# Patient Record
Sex: Male | Born: 1980 | Hispanic: Yes | Marital: Married | Smoking: Never smoker
Health system: Southern US, Community
[De-identification: ages and names within clinical notes are randomized; demographics above are authoritative.]

---

## 2010-09-28 ENCOUNTER — Emergency Department (HOSPITAL_COMMUNITY)
Admission: EM | Admit: 2010-09-28 | Discharge: 2010-09-29 | Disposition: A | Discharge status: Home / Self Care | Payer: Self-pay | Attending: Emergency Medicine | Admitting: Emergency Medicine

## 2010-09-28 ENCOUNTER — Emergency Department (HOSPITAL_COMMUNITY): Payer: Self-pay

## 2010-09-28 DIAGNOSIS — R110 Nausea: Secondary | ICD-10-CM | POA: Insufficient documentation

## 2010-09-28 DIAGNOSIS — K2970 Gastritis, unspecified, without bleeding: Secondary | ICD-10-CM | POA: Insufficient documentation

## 2010-09-28 DIAGNOSIS — R079 Chest pain, unspecified: Secondary | ICD-10-CM | POA: Insufficient documentation

## 2010-09-28 DIAGNOSIS — R142 Eructation: Secondary | ICD-10-CM | POA: Insufficient documentation

## 2010-09-28 DIAGNOSIS — R1013 Epigastric pain: Secondary | ICD-10-CM | POA: Insufficient documentation

## 2010-09-28 DIAGNOSIS — R141 Gas pain: Secondary | ICD-10-CM | POA: Insufficient documentation

## 2010-09-28 DIAGNOSIS — R12 Heartburn: Secondary | ICD-10-CM | POA: Insufficient documentation

## 2010-09-28 DIAGNOSIS — K219 Gastro-esophageal reflux disease without esophagitis: Secondary | ICD-10-CM | POA: Insufficient documentation

## 2010-09-28 LAB — DIFFERENTIAL
Basophils Absolute: 0 10*3/uL (ref 0.0–0.1)
Eosinophils Relative: 7 % — ABNORMAL HIGH (ref 0–5)
Lymphs Abs: 2.4 10*3/uL (ref 0.7–4.0)
Monocytes Absolute: 0.5 10*3/uL (ref 0.1–1.0)
Monocytes Relative: 5 % (ref 3–12)
Neutrophils Relative %: 60 % (ref 43–77)

## 2010-09-28 LAB — POCT CARDIAC MARKERS
CKMB, poc: <1 ng/mL — ABNORMAL LOW (ref 1.0–8.0)
Myoglobin, poc: 48.6 ng/mL (ref 12–200)

## 2010-09-28 LAB — CBC
HCT: 48.9 % (ref 39.0–52.0)
Hemoglobin: 18 g/dL — ABNORMAL HIGH (ref 13.0–17.0)
RBC: 6.2 MIL/uL — ABNORMAL HIGH (ref 4.22–5.81)
WBC: 8.9 10*3/uL (ref 4.0–10.5)

## 2010-09-28 LAB — BASIC METABOLIC PANEL
Calcium: 9.7 mg/dL (ref 8.4–10.5)
GFR calc Af Amer: >60 mL/min (ref >60)
GFR calc non Af Amer: >60 mL/min (ref >60)
Glucose, Bld: 93 mg/dL (ref 70–99)
Sodium: 137 mEq/L (ref 135–145)

## 2010-09-29 ENCOUNTER — Emergency Department (HOSPITAL_COMMUNITY): Payer: Self-pay

## 2010-09-29 LAB — HEPATIC FUNCTION PANEL
AST: 26 U/L (ref 0–37)
Albumin: 4.3 g/dL (ref 3.5–5.2)
Bilirubin, Direct: 0.1 mg/dL (ref 0.0–0.3)

## 2012-01-11 ENCOUNTER — Emergency Department (HOSPITAL_COMMUNITY)
Admission: EM | Admit: 2012-01-11 | Discharge: 2012-01-11 | Disposition: A | Discharge status: Home / Self Care | Payer: No Typology Code available for payment source | Attending: Emergency Medicine | Admitting: Emergency Medicine

## 2012-01-11 ENCOUNTER — Encounter (HOSPITAL_COMMUNITY): Payer: Self-pay | Admitting: *Deleted

## 2012-01-11 ENCOUNTER — Emergency Department (HOSPITAL_COMMUNITY): Payer: No Typology Code available for payment source

## 2012-01-11 DIAGNOSIS — R079 Chest pain, unspecified: Secondary | ICD-10-CM | POA: Insufficient documentation

## 2012-01-11 DIAGNOSIS — R109 Unspecified abdominal pain: Secondary | ICD-10-CM | POA: Insufficient documentation

## 2012-01-11 DIAGNOSIS — M549 Dorsalgia, unspecified: Secondary | ICD-10-CM | POA: Insufficient documentation

## 2012-01-11 DIAGNOSIS — R220 Localized swelling, mass and lump, head: Secondary | ICD-10-CM | POA: Insufficient documentation

## 2012-01-11 DIAGNOSIS — S298XXA Other specified injuries of thorax, initial encounter: Secondary | ICD-10-CM

## 2012-01-11 DIAGNOSIS — S8010XA Contusion of unspecified lower leg, initial encounter: Secondary | ICD-10-CM | POA: Insufficient documentation

## 2012-01-11 LAB — POCT I-STAT, CHEM 8
BUN: 12 mg/dL (ref 6–23)
Chloride: 104 mEq/L (ref 96–112)
Creatinine, Ser: 1 mg/dL (ref 0.50–1.35)
Potassium: 3.4 mEq/L — ABNORMAL LOW (ref 3.5–5.1)
Sodium: 142 mEq/L (ref 135–145)

## 2012-01-11 LAB — DIFFERENTIAL
Eosinophils Relative: 2 % (ref 0–5)
Lymphocytes Relative: 8 % — ABNORMAL LOW (ref 12–46)
Lymphs Abs: 1.4 10*3/uL (ref 0.7–4.0)
Neutro Abs: 13.8 10*3/uL — ABNORMAL HIGH (ref 1.7–7.7)
Neutrophils Relative %: 85 % — ABNORMAL HIGH (ref 43–77)

## 2012-01-11 LAB — CBC
MCV: 79 fL (ref 78.0–100.0)
Platelets: 202 10*3/uL (ref 150–400)
RBC: 5.82 MIL/uL — ABNORMAL HIGH (ref 4.22–5.81)
WBC: 16.2 10*3/uL — ABNORMAL HIGH (ref 4.0–10.5)

## 2012-01-11 MED ORDER — SODIUM CHLORIDE 0.9 % IV SOLN
INTRAVENOUS | Status: DC
  Administered 2012-01-11: 21:00:00 via INTRAVENOUS

## 2012-01-11 MED ORDER — FENTANYL CITRATE 0.05 MG/ML IJ SOLN
50.0000 ug | Freq: Once | INTRAMUSCULAR | Status: AC
  Administered 2012-01-11: 50 ug via INTRAVENOUS
  Filled 2012-01-11: qty 2

## 2012-01-11 MED ORDER — IOHEXOL 300 MG/ML  SOLN
100.0000 mL | Freq: Once | INTRAMUSCULAR | Status: AC | PRN
  Administered 2012-01-11: 100 mL via INTRAVENOUS

## 2012-01-11 MED ORDER — OXYCODONE-ACETAMINOPHEN 5-325 MG PO TABS: 1.0000 | ORAL_TABLET | Freq: Four times a day (QID) | ORAL | Status: AC | PRN

## 2012-01-11 MED ORDER — SODIUM CHLORIDE 0.9 % IV BOLUS (SEPSIS)
500.0000 mL | Freq: Once | INTRAVENOUS | Status: AC
  Administered 2012-01-11: 500 mL via INTRAVENOUS
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
## 2012-01-11 NOTE — ED Notes (Signed)
Pt does not speak english. ems spoke spanish to patient for rn to triage
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
## 2012-01-11 NOTE — ED Notes (Signed)
Notified pt of x-ray/CT results.  Pt verbalized understanding, had no further questions.
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
## 2012-01-11 NOTE — ED Notes (Signed)
Per ems: pt does not have any neuro deficts
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
## 2012-01-11 NOTE — Discharge Instructions (Signed)
Traumatismo Torcico Contuso (Blunt Chest Trauma)  El traumatismo torcico contuso es una lesin causada por un golpe en el pecho. Estas lesiones suelen ser muy dolorosas. Generalmente resulta en un hematoma o en costillas rotas (fracturadas). La mayor parte de los hematomas y las fracturas de costillas por traumatismos torcicos contusos mejoran despus de 1 a 3 semanas de reposo y uso de medicamentos para Chief Technology Officer. Generalmente, los tejidos blandos de la pared torcica tambin se lesionan, lo que produce dolor y hematomas. Los rganos internos, como el corazn y los pulmones, tambin pueden sufrir lesiones. El traumatismo torcico contuso puede producir problemas mdicos graves. Este tipo de lesin requiere atencin mdica inmediata.  CAUSAS   Colisiones en vehculos de motor.   Cadas.   Violencia fsica.   Lesiones deportivas.  SNTOMAS   Dolor en el pecho. El dolor puede empeorar al moverse o respirar profundamente.   Falta de aire.   Aturdimiento.   Hematomas.   Sensibilidad.   Hinchazn.  DIAGNSTICO  El mdico le har un examen fsico. Podrn tomarle radiografas para comprobar si hay fracturas. Sin embargo, las fracturas pequeas en las costillas pueden no aparecer en las radiografas hasta unos das despus de la lesin. Si se sospecha una lesin ms grave, podrn indicarle otras pruebas de diagnstico por imgenes. Puede incluir ecografas, tomografa computada (TC) o resonancia magntica (IMR).  TRATAMIENTO  El tratamiento depende de la gravedad de la lesin. El mdico podr recetarle medicamentos para Primary school teacher e indicarle ejercicios de respiracin profunda.  INSTRUCCIONES PARA EL CUIDADO EN EL HOGAR   Limite sus actividades hasta que pueda moverse sin sentir Scientist, forensic.   No realice trabajos extenuantes hasta que la lesin se haya curado.   Aplique hielo sobre la zona lesionada.   Ponga el hielo en una bolsa plstica.   Colquese una toalla entre la  piel y la bolsa de hielo.   Deje el hielo durante 15 a 20 minutos, 3 a 4 veces por da.   Podr utilizar una faja para las costillas para reducir Chief Technology Officer segn le hayan indicado.   Realice inspiraciones, profundas segn las indicaciones del mdico, para mantener los pulmones limpios.   Slo tome medicamentos de venta libre o recetados para Primary school teacher, la fiebre, o el [place], segn las indicaciones de su mdico.  SOLICITE ATENCIN MDICA DE [employer] SI:  Siente falta de aire o dolor en el pecho cada vez ms intensos.   Tose y escupe sangre.   Tiene nuseas, vmitos o dolor abdominal.   Tiene fiebre.   Se siente mareado, dbil o se desmaya.  ASEGRESE DE QUE:   Comprende estas instrucciones.   Controlar su enfermedad.   Solicitar ayuda de inmediato si no mejora o si empeora.  Document Released: 07/12/2005 Document Revised: 07/01/2011 [facility] Patient Information 2012 [place], [place].Colisin con un vehculo de motor Academic librarian) Luego de una colisin, es comn presentar mltiples moretones y Research scientist (life sciences). Estas molestias generalmente empeoran durante las primeras 24 horas. Usted gradualmente se pondr ms rgido y con ms dolor en las horas siguientes. Podr sentirse peor cuando despierte en la maana siguiente al accidente. A partir de all, debera comenzar a Risk manager que pase. La velocidad con que se mejora generalmente depende de la gravedad de la colisin, la cantidad de lesiones y la ubicacin y Firefighter de las mismas. INSTRUCCIONES PARA EL CUIDADO EN EL HOGAR   Aplique hielo sobre la zona lesionada.   Ponga el hielo en [employer]  plstica.   Colquese una toalla entre la piel y la bolsa de hielo.   Deje el hielo durante 15 a 20 minutos, 3 a 4 veces por da.   Debe ingerir gran cantidad de lquido para mantener la orina de tono claro o color amarillo plido.  No beba alcohol.   Tome una ducha o un bao caliente o bese una o dos  veces por da. Esto aumentar el flujo de [employer] msculos doloridos.   Puede volver a sus ocupaciones cuando se lo indique el mdico. Tenga cuidado al levantar objetos, ya que puede agravar el dolor en el cuello o en la espalda.   Utilice los medicamentos de venta libre o de prescripcin para Chief Technology Officer, Environmental health practitioner o la [place], segn se lo indique el profesional que lo asiste. No tome aspirina. Podran aumentar los hematomas o las hemorragias.  SOLICITE ATENCIN MDICA DE INMEDIATO SI SIENTE:  Entumecimiento, hormigueo, debilidad o problemas con el uso de los brazos o las piernas.   Dolor de cabeza intenso que no mejora con medicamentos.   Siente dolor intenso en el cuello, especialmente sensibilidad en el centro de la espalda o el cuello.   Cambios en el control del intestino o la vejiga.   Aumento del dolor en cualquier parte del cuerpo.   Falta de aire, mareos o [place].   Siente dolor en el pecho.   Nuseas, vmitos o sudoracin.   Aumento del Dentist abdominal.   Sangre en la orina, en las heces o vmitos con [place].   Siente dolor en los hombros (en la zona de los breteles).   Que sus sntomas empeoran.  EST SEGURO QUE:   Comprende las instrucciones para el alta mdica.   Controlar su enfermedad.   Solicitar atencin mdica de inmediato segn las indicaciones.  Document Released: 04/21/2005 Document Revised: 07/01/2011 [facility] Patient Information 2012 [place], [place].Contusin  (Contusion)  Una contusin es un hematoma interno. Las contusiones son el resultado de un traumatismo que produce un sangrado debajo de la piel. La contusin [employer], prpura o [place]. Un traumatismo menor ocasionar un hematoma indoloro, pero las contusiones ms importantes pueden doler y Personal assistant hinchadas durante varias semanas.  CAUSAS  Generalmente la causa de la contusin es un golpe, un traumatismo o una fuerza directa ejercida en una zona del cuerpo.    SNTOMAS   Hinchazn y enrojecimiento en la zona lesionada.   Hematoma en la zona lesionada.   Sensibilidad e inflamacin en la zona lesionada.   Dolor.  DIAGNSTICO  El diagnstico puede hacerse a travs de la historia clnica y el examen fsico. Ser necesaria una radiografa o una tomografa computada para determinar si hay lesiones asociadas, como fracturas.  TRATAMIENTO  El tratamiento especfico depender de qu parte del cuerpo se lesion. En general, el mejor tratamiento para una contusin es el reposo, hielo, elevacin, y la aplicacin de compresas fras en el rea afectada. Tambin se recomiendan los medicamentos de venta libre para el control del dolor. Pregunte a su mdico cul es el mejor tratamiento para su contusin.  INSTRUCCIONES PARA EL CUIDADO EN EL HOGAR   Aplique hielo sobre la zona lesionada.   Ponga el hielo en una bolsa plstica.   Colquese una toalla entre la piel y la bolsa de hielo.   Deje el hielo durante 15 a 20 minutos, 3 a 4 veces por da.   Slo tome medicamentos de venta libre o recetados para Primary school teacher, las molestias o Publishing copy  la fiebre segn las indicaciones de su mdico. El mdico puede indicarle que evite los antiinflamatorios (aspirina, ibuprofeno y naproxeno) durante 48 horas debido a que estos medicamentos pueden aumentar el hematoma.   Haga que la zona lesionada repose.   En lo posible, eleve la zona lesionada para disminuir la hinchazn.  SOLICITE ATENCIN MDICA DE INMEDIATO SI:   El hematoma o la hinchazn aumentan.   Siente que Community education officer.   El dolor o la hinchazn no se alivian con los medicamentos.  ASEGRESE DE QUE:   Comprende estas instrucciones.   Controlar su enfermedad.   Solicitar ayuda de inmediato si no mejora o si empeora.  Document Released: 04/21/2005 Document Revised: 07/01/2011 [facility] Patient Information 2012 [place], [place].

## 2012-03-20 IMAGING — US US ABDOMEN COMPLETE
1 series · 14 of 25 positions shown · non-contrast
Comparison: None.

CLINICAL DATA: Chest pain, abdominal pain.

COMPLETE ABDOMINAL ULTRASOUND

[Series 1: us abdomen complete · 0.30mm/px · 14 of 46 slices shown]
[im 1/46]
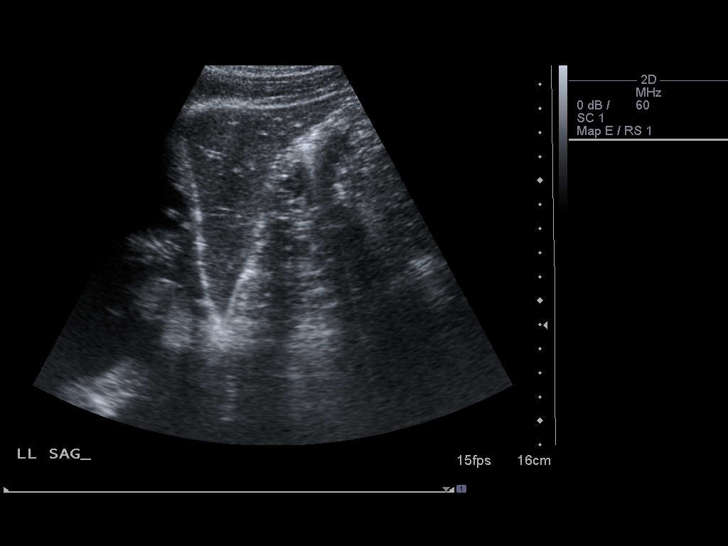
[im 4/46]
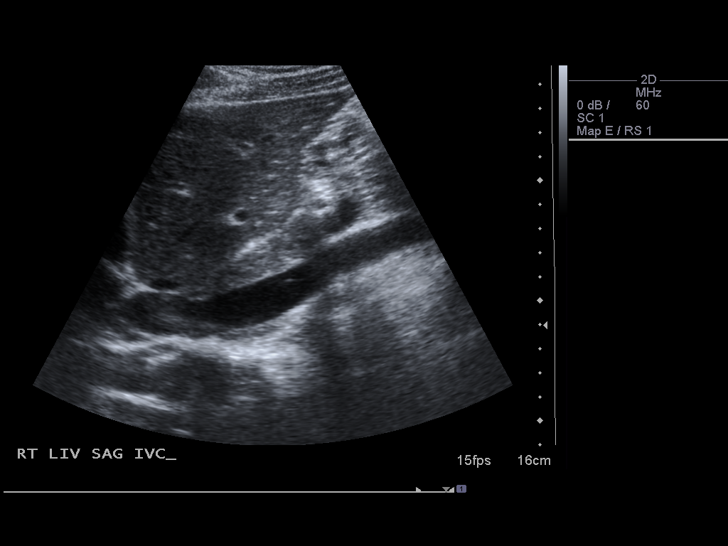
[im 8/46]
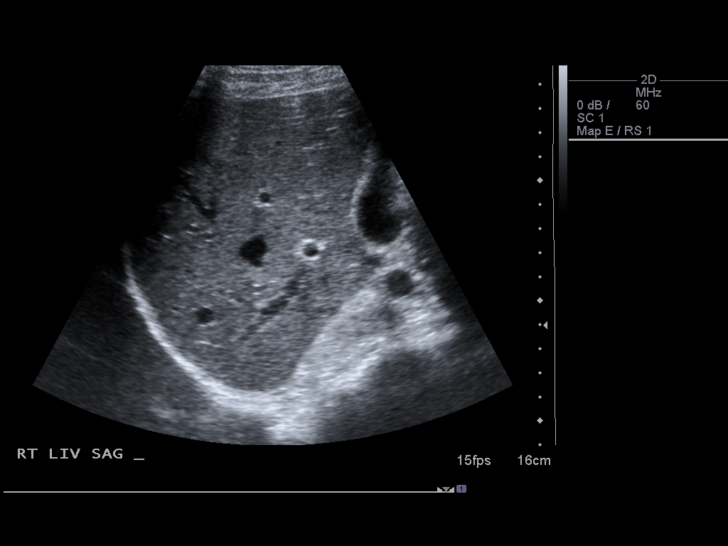
[im 12/46]
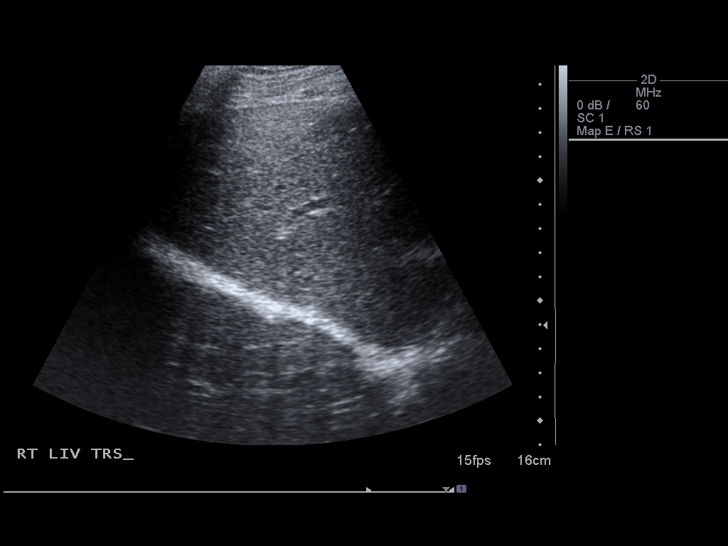
[im 16/46]
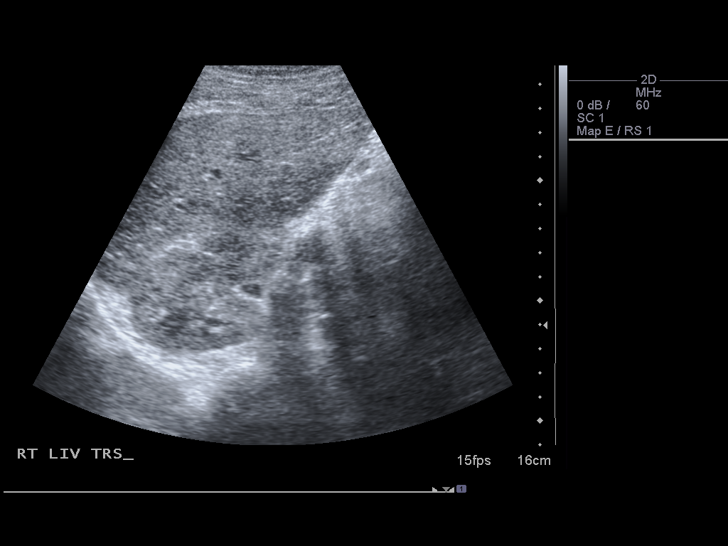
[im 17/46]
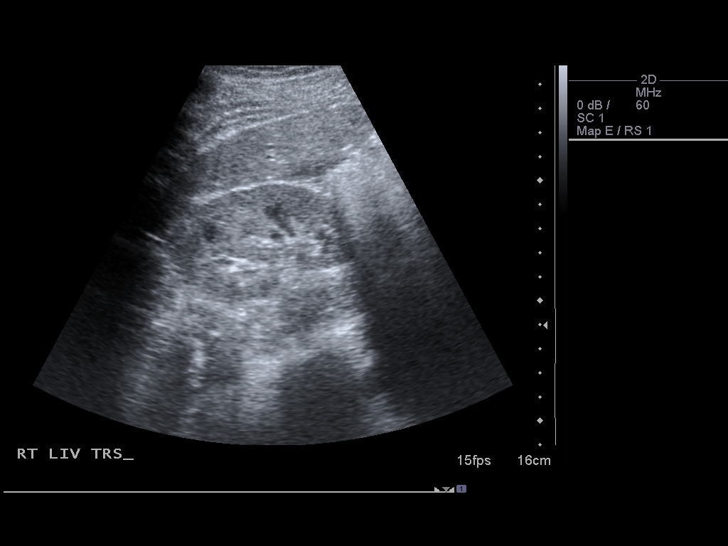
[im 21/46]
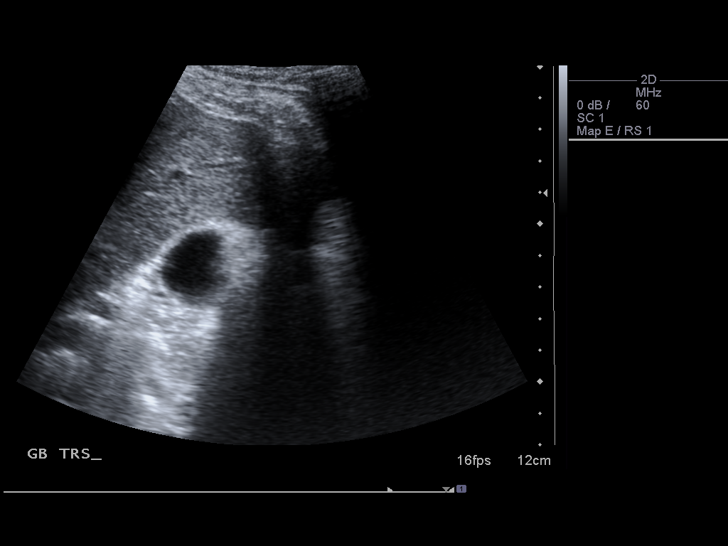
[im 25/46]
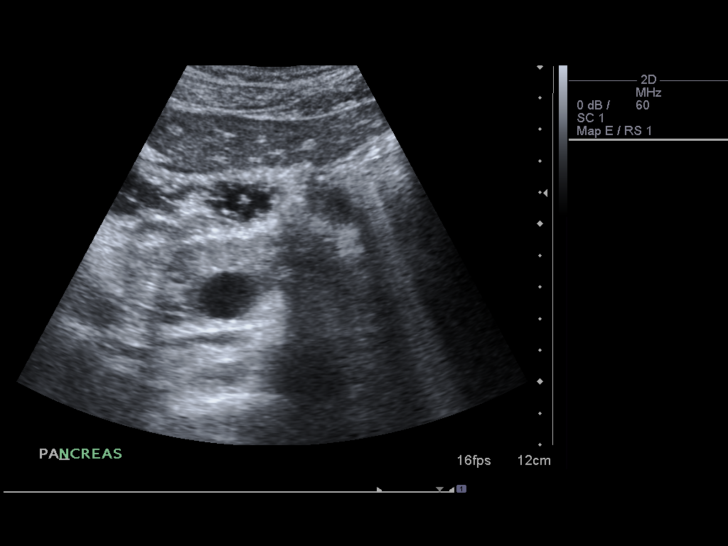
[im 29/46]
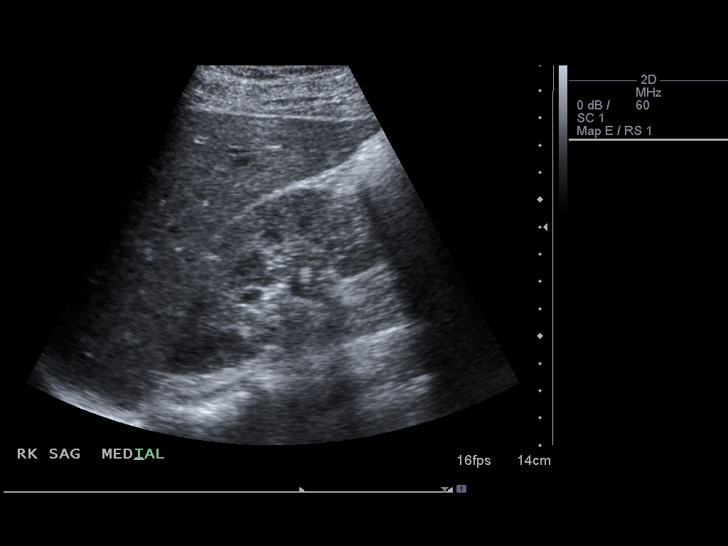
[im 31/46]
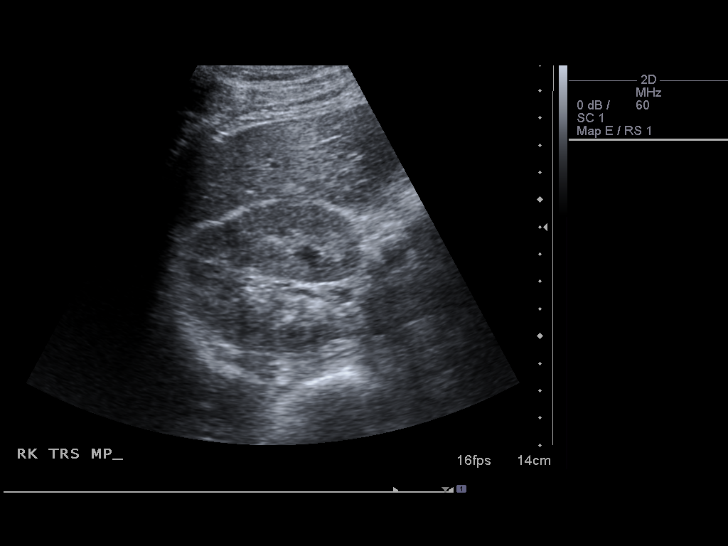
[im 34/46]
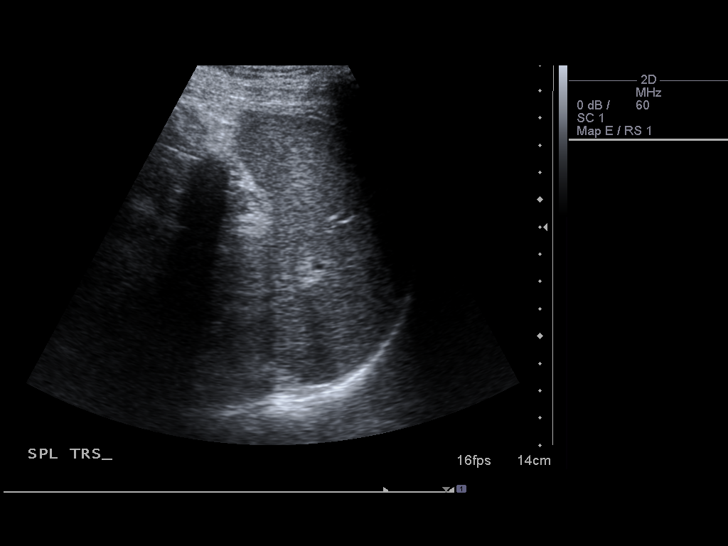
[im 38/46]
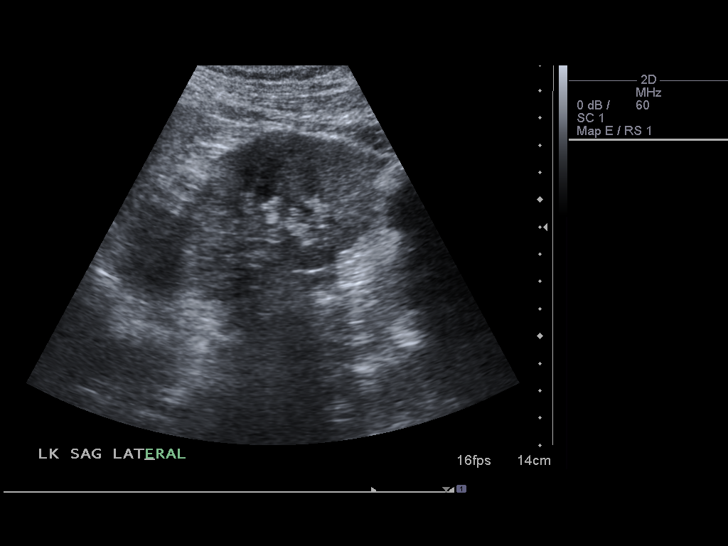
[im 42/46]
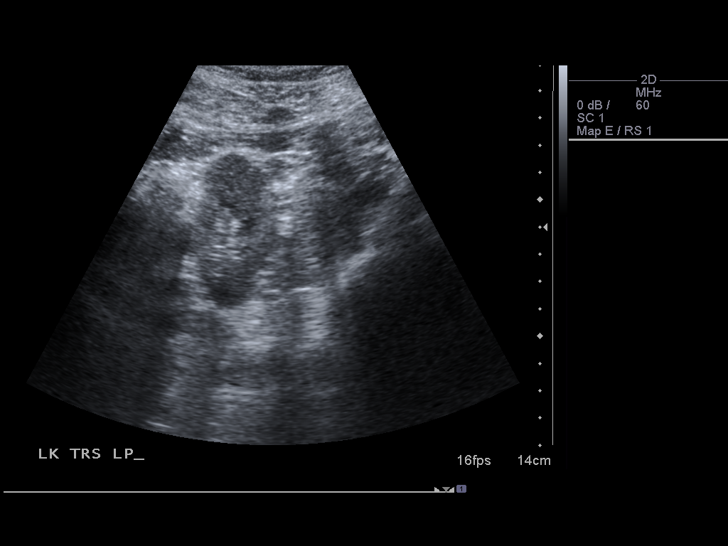
[im 46/46]
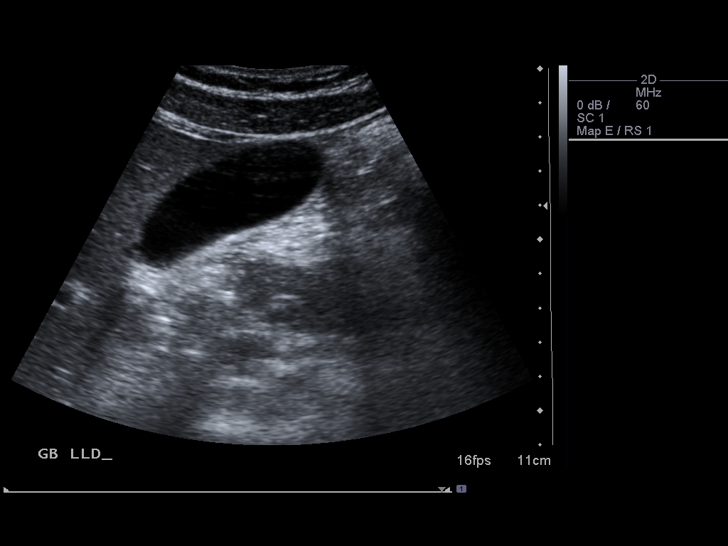

[14 of 25 positions shown; findings below may reference images not displayed]

FINDINGS: Gallbladder:  No gallstones, gallbladder wall thickening, or
pericholecystic fluid.

Common bile duct:   Within normal limits in caliber.

Liver:  No focal lesion identified.  Within normal limits in
parenchymal echogenicity.

IVC:  Appears normal.

Pancreas:  No focal abnormality seen.

Spleen:  Within normal limits in size and echotexture.

Right Kidney:   Normal in size and parenchymal echogenicity.  No
evidence of mass or hydronephrosis.

Left Kidney:  Normal in size and parenchymal echogenicity.  No
evidence of mass or hydronephrosis.

Abdominal aorta:  No aneurysm identified.
IMPRESSION: Negative abdominal ultrasound.
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
## 2013-07-10 ENCOUNTER — Other Ambulatory Visit: Payer: Self-pay | Admitting: Family

## 2013-07-10 ENCOUNTER — Other Ambulatory Visit: Payer: Self-pay

## 2013-07-10 DIAGNOSIS — R05 Cough: Secondary | ICD-10-CM

## 2015-09-26 ENCOUNTER — Encounter (HOSPITAL_COMMUNITY): Payer: Self-pay | Admitting: *Deleted

## 2015-09-26 ENCOUNTER — Emergency Department (HOSPITAL_COMMUNITY): Payer: Self-pay

## 2015-09-26 ENCOUNTER — Emergency Department (HOSPITAL_COMMUNITY)
Admission: EM | Admit: 2015-09-26 | Discharge: 2015-09-26 | Disposition: A | Discharge status: Home / Self Care | Payer: Self-pay | Attending: Emergency Medicine | Admitting: Emergency Medicine

## 2015-09-26 DIAGNOSIS — N5089 Other specified disorders of the male genital organs: Secondary | ICD-10-CM | POA: Insufficient documentation

## 2015-09-26 DIAGNOSIS — R110 Nausea: Secondary | ICD-10-CM | POA: Insufficient documentation

## 2015-09-26 DIAGNOSIS — N50812 Left testicular pain: Secondary | ICD-10-CM

## 2015-09-26 LAB — URINALYSIS, ROUTINE W REFLEX MICROSCOPIC
BILIRUBIN URINE: NEGATIVE
GLUCOSE, UA: NEGATIVE mg/dL
HGB URINE DIPSTICK: NEGATIVE
KETONES UR: NEGATIVE mg/dL
LEUKOCYTES UA: NEGATIVE
Nitrite: NEGATIVE
PROTEIN: NEGATIVE mg/dL
Specific Gravity, Urine: 1.021 (ref 1.005–1.030)
pH: 5.5 (ref 5.0–8.0)

## 2015-09-26 LAB — BASIC METABOLIC PANEL
Anion gap: 12 (ref 5–15)
BUN: 10 mg/dL (ref 6–20)
CALCIUM: 9.6 mg/dL (ref 8.9–10.3)
CO2: 24 mmol/L (ref 22–32)
CREATININE: 0.75 mg/dL (ref 0.61–1.24)
Chloride: 103 mmol/L (ref 101–111)
GFR calc non Af Amer: >60 mL/min (ref >60)
Glucose, Bld: 99 mg/dL (ref 65–99)
Potassium: 3.9 mmol/L (ref 3.5–5.1)
SODIUM: 139 mmol/L (ref 135–145)

## 2015-09-26 LAB — CBC
HCT: 47.5 % (ref 39.0–52.0)
Hemoglobin: 17.3 g/dL — ABNORMAL HIGH (ref 13.0–17.0)
MCH: 28.8 pg (ref 26.0–34.0)
MCHC: 36.4 g/dL — AB (ref 30.0–36.0)
MCV: 79 fL (ref 78.0–100.0)
PLATELETS: 191 10*3/uL (ref 150–400)
RBC: 6.01 MIL/uL — ABNORMAL HIGH (ref 4.22–5.81)
RDW: 12.5 % (ref 11.5–15.5)
WBC: 6.7 10*3/uL (ref 4.0–10.5)

## 2015-09-26 MED ORDER — OXYCODONE-ACETAMINOPHEN 5-325 MG PO TABS
ORAL_TABLET | ORAL | Status: AC
  Filled 2015-09-26: qty 1

## 2015-09-26 MED ORDER — OXYCODONE-ACETAMINOPHEN 5-325 MG PO TABS
1.0000 | ORAL_TABLET | Freq: Once | ORAL | Status: AC
  Administered 2015-09-26: 1 via ORAL

## 2015-09-26 MED ORDER — IBUPROFEN 800 MG PO TABS: 800.0000 mg | ORAL_TABLET | Freq: Three times a day (TID) | ORAL | Status: AC | PRN

## 2015-09-26 NOTE — Discharge Instructions (Signed)
Read the information below.  Use the prescribed medication as directed.  Please discuss all new medications with your pharmacist.  You may return to the Emergency Department at any time for worsening condition or any new symptoms that concern you.   If you develop high fevers, worsening abdominal pain, uncontrolled vomiting, or are unable to tolerate fluids by mouth, return to the ER for a recheck.    Lea la informacin a continuacin. Use la medicacin prescrita como se indica. Por favor discuta todos los nuevos medicamentos con su farmacutico. Usted puede regresar al Departamento de Emergencias en cualquier momento por empeoramiento de la condicin o cualquier nuevo sntoma que le afecte. Si desarrolla fiebres altas, empeora el dolor abdominal, vmitos incontrolados, o es incapaz de Web designer lquidos por la boca, regrese a la sala de emergencias para una revisin.   Autoexamen testicular (Testicular Self-Exam) El autoexamen de los testculos consiste en la observacin y palpacin de los testculos para buscar bultos o hinchazn anormales. Hay varios factores que pueden causar hinchazn, bultos o dolor en los testculos. Entre ellos se incluyen:  Lesiones.  Inflamacin.  Infeccin.  Acumulacin de lquido en el testculo (hidrocele).  Testculos rotados (torsin testicular).  Cncer de testculo. El autoexamen de la zona de los testculos y de la ingle se aconseja si usted tiene riesgo de Doctor, hospital. Los riesgos de cncer testicular son:  Un testculo no descendido (criptorquidismo).  Historia previa de cncer testicular.  Historia familiar de cncer testicular. Es ms fcil examinarlos despus de una ducha o un bao caliente y es ms difcil examinarlos cuando usted est fro. Esto se debe a que los msculos adheridos a los testculos se retraen y los Research scientist (physical sciences) hacia el abdomen. Siga estos pasos mientras est de pie:  Sostenga el pene separado del cuerpo.  Haga rodar un  testculo entre el pulgar y el ndice, sintiendo todo el testculo.  Haga rodar el otro testculo entre el pulgar y el ndica, sintiendo todo el testculo. Sienta la presencia de bultos, hinchazn o molestias. Un testculo normal tiene la forma de un huevo y se siente firme. Es blando y no duele. El cordn espermtico se siente como una cuerda firme, similar a un espagueti en la parte posterior del testculo. Tambin es importante que examine el pliegue que se encuentra entre el frente de la pierna y el abdomen. Palpe si hay bultos que le duelen. Podra tratarse de ganglios linfticos agrandados.    Esta informacin no tiene Theme park manager el consejo del mdico. Asegrese de hacerle al mdico cualquier pregunta que tenga.   Document Released: 04/21/2005 Document Revised: 03/14/2013 Elsevier Interactive Patient Education 2016 [employer].  [employer] Assistance The United Ways 211 is a great source of information about community services available.  Access by dialing 2-1-1 from anywhere in [place], or by website -  PooledIncome.pl.   Other Local Resources (Updated 07/2015)  Financial Assistance   Services    Phone Number and Address  [facility]  Low-cost medical care - 1st and 3rd Saturday of every month  Must not qualify for public or private insurance and must have limited income [phone] [address] [place], [place]    [facility] [employer] of Social Services  Child care  Emergency assistance for housing and [employer]  Medicaid [phone] [address] [place], [place] [address]   [facility] Department  Low-cost medical care for children, communicable diseases, sexually-transmitted diseases, immunizations, maternity care,  womens health and family planning [phone] [address] [place], [place] [address]  [facility] Medication  Management [facility]   Medication assistance for [facility] residents  Must meet income requirements [phone] [address] [place], [place].    [facility] Social Services  Child care  Emergency assistance for housing and [employer]  Medicaid [phone] [address] [place], [place] [address]  [facility]   Low-cost medical care,   Monday through Friday, 9 am to 6 pm.   Accepts Medicare/Medicaid, and self-pay [phone] [address] [place], [place] [address]  [facility]  Low-cost medical care - Monday through Friday, 8:30 am - 5:30 pm  Accepts Medicaid and self-pay [phone] [address] [place], [place] [address]   [facility]  Primary medical care, including for those with sickle cell disease  Accepts Medicare, Medicaid, insurance and self-pay [phone] [address] [place], [place]  [facility]   Primary medical care  Accepts Medicare, [place], insurance and self-pay [phone] [address] [place], [place] [address]   [facility] Department of Social Services  Child care  Emergency assistance for housing and [employer]  Medicaid [phone] [address] [place], [place] [address]  [facility] Department of Health and [employer]  Child care  Emergency assistance for housing and [employer]  Medicaid [phone] [address] [place], [place] [address]   [facility] Medication Assistance Program  Medication assistance for [facility] residents with no insurance only  Must have a primary care doctor [phone] [address] [place], [place]  [facility]   Primary medical care  [place], [place], insurance  [phone] [address] [place], [place]  [facility]   Medication  assistance [phone]  [facility]   Primary medical care  Accepts Medicare, [place], insurance and self-pay [phone] [address] [place], [place] [address]  [facility]   Primary medical care  Accepts Medicare, [place], insurance and self-pay [phone] [address] [place], [place] [address]  [facility]  For [place] residents between the ages of 27 and 61 who do not have any form of health insurance, Medicare, [place], or [place] benefits.  Services are provided free of charge to uninsured patients who fall within federal poverty guidelines.    Hours: Tuesdays and Thursdays, 4:15 - 8 pm [phone] [address] [place], [place] [address]  [facility]     Primary medical care  Dental care  Nutritional counseling  Pharmacy  Accepts Medicaid, Medicare, most insurance.  Fees are adjusted based on ability to pay.   [phone] [facility] [address] [place], [place]  [phone] [facility] [address] [place], [place]  [phone] [facility] [place], [place]  [phone] [facility], [address] [place], [place]  [phone] [facility] [address] [place], [place]  [facility]  Womens health and family planning [phone] [address] [place], [place]  [facility] Department of Social Services  Child care  Emergency assistance for housing and [employer]  Medicaid [phone] [address], [place], [place] [address]   [facility]    Ages 63 and older  Hours: Mondays and Thursdays, 7:00 am -  9:00 am Patients are seen on a first come, first served basis. [phone], ext. [phone] [place], [place]  [facility] of Social Services  Child care  Emergency  assistance for housing and [employer]ly[employer]arkutilities  Food stamps  Medicaid [phone] [address] [place], [place] [id number]  [facility]  Medication assistance  Rental assistance  Food pantry  Medication assistance  Housing assistance  Emergency food distribution  Utility assistance [phone] [address] [place], [place]  [phone] [phone]  [address] [place], [place] [phone] Hours: Tuesdays and Thursdays from 9am - 12 noon by appointment only  [phone] [address] [place], [place] [address]  [facility] - Lanae Boastlara F. Gunn   Accepts private insurance, [place], and [place].  Payment is based on a sliding scale for those without insurance.  Hours: Mondays, Tuesdays and Thursdays, 8:30 am - 5:30 pm.   [phone] [address] [place], [place]  [facility] - [facility] at [facility]    Accepts private insurance, [place], and [place].  Payment is based on a sliding scale for those without insurance. [phone] [address] [place], [place]  [facility] - Pediatrics at E. Scientist, research (physical sciences)[place]  Accepts private insurance, [employer]icare, and [place]icaid.  Payment is based on a sliding scale for those without insurance [phone] [address], [place], [place]  [facility] - [facility]s at [place]  Accepts private insurance, [place], and [place].  Payment is based on a sliding scale for those without insurance. [phone] [address] [place], [place]  [facility] - [facility]s at [facility]  Accepts private insurance, [place], and [place].  Payment is based on a sliding scale for those without insurance. [phone], [phone]. [phone] [place], [place].    [facility]  Maternity care.  Accepts Medicaid and self-pay. [phone] [address] [place], [place]

## 2015-09-26 NOTE — ED Notes (Signed)
PT states L inguinal and L testicle pain/swelling x 3 days.  Denies problems urinating.

## 2015-09-26 NOTE — ED Provider Notes (Signed)
CSN: [id number]     Arrival date & time 09/26/15  1243 History  By signing my name below, I, Freida Busmaniana Omoyeni, attest that this documentation has been prepared under the direction and in the presence of non-physician practitioner, Trixie DredgeEmily Elan Mcelvain, PA-C. Electronically Signed: Freida Busmaniana Omoyeni, Scribe. 09/26/2015. 2:22 PM.    Chief Complaint  Patient presents with  . Groin Pain   The history is provided by the patient. No language interpreter was used.     HPI Comments:  Dustin Ferguson is a 35 y.o. male who presents to the Emergency Department complaining of strong pain since yesterday to his LLQ of the abdomen and left testicle and increased swelling at the site. His pain is exacerbated with urination and when coughing. Pt reports associated nausea, and a mass on his left testicle. He has 6 month history of a left sided pain that hurts more after working a lot  that he assumes is a hernia, but is unsure if symptoms today is related. Pt denies fever, vomiting, diarrhea, constipation, bloody stool, and penile discharge. No alleviating factors noted.  Pt denies any sexual activity or concern for STD.    History reviewed. No pertinent past medical history. History reviewed. No pertinent past surgical history. No family history on file. Social History  Substance Use Topics  . Smoking status: Never Smoker   . Smokeless tobacco: None  . Alcohol Use: Yes     Comment: occ    Review of Systems  Constitutional: Negative for fever and chills.  Gastrointestinal: Positive for abdominal pain. Negative for vomiting, diarrhea, constipation and blood in stool.  Genitourinary: Positive for testicular pain. Negative for discharge.  All other systems reviewed and are negative.   Allergies  Review of patient's allergies indicates no known allergies.  Home Medications   Prior to Admission medications   Not on File   BP 125/82 mmHg  Pulse 65  Temp(Src) 97.9 F (36.6 C) (Oral)  Resp 16  Wt 187 lb  (84.823 kg) Physical Exam  Constitutional: He appears well-developed and well-nourished. No distress.  HENT:  Head: Normocephalic and atraumatic.  Neck: Neck supple.  Cardiovascular: Normal rate and regular rhythm.   Pulmonary/Chest: Effort normal and breath sounds normal. No respiratory distress. He has no wheezes. He has no rales.  Abdominal: Soft. He exhibits no distension and no mass. There is tenderness (LLQ). There is no rebound and no guarding. Hernia confirmed negative in the left inguinal area.  Genitourinary: Penis normal. Right testis shows no mass, no swelling and no tenderness. Right testis is descended. Left testis shows mass and tenderness. Left testis shows no swelling. Left testis is descended. No penile erythema. No discharge found.  Nodule noted to left testicle No erythema, edema, of warmth of scrotum   Chaperone (RN) was present for exam which was performed with no discomfort or complications.   Neurological: He is alert. He exhibits normal muscle tone.  Skin: He is not diaphoretic.  Nursing note and vitals reviewed.   ED Course  Procedures    DIAGNOSTIC STUDIES:  Oxygen Saturation is 100% on RA, normal by my interpretation.    COORDINATION OF CARE:  1:19 PM Discussed treatment plan with pt at bedside and pt agreed to plan.  Labs Review Labs Reviewed  URINALYSIS, ROUTINE W REFLEX MICROSCOPIC (NOT AT [facility]) - Abnormal; Notable for the following:    APPearance CLOUDY (*)    All other components within normal limits  CBC - Abnormal; Notable for the following:  RBC 6.01 (*)    Hemoglobin 17.3 (*)    MCHC 36.4 (*)    All other components within normal limits  BASIC METABOLIC PANEL    Imaging Review US Scrotum  09/26/2015  CLINICAL DATA:  Left testicle pain for 1 day. EXAM: SCROTAL ULTRASOUND DOPPLER ULTRASOUND OF THE TESTICLES TECHNIQUE: Complete ultrasound examination of the testicles, epididymis, and other scrotal structures was performed. Color and  spectral Doppler ultrasound were also utilized to evaluate blood flow to the testicles. COMPARISON:  None. FINDINGS: Right testicle Measurements: 3.4 x 2.3 x 2.8 cm. No mass or microlithiasis visualized. Left testicle Measurements: 3.5 x 2.1 x 2.5 cm. No mass or microlithiasis visualized. No gross abnormality in the left inguinal canal which is the area of concern. Right epididymis:  Normal in size and appearance. Left epididymis:  Normal in size and appearance. Hydrocele:  None visualized. Varicocele:  None visualized. Pulsed Doppler interrogation of both testes demonstrates normal low resistance arterial and venous waveforms bilaterally. IMPRESSION: Normal scrotal ultrasound.  Negative for testicular torsion. Electronically Signed   By: Richarda Overlie M.D.   On: 09/26/2015 15:51   [place] Art/ven Flow Abd Pelv Doppler  09/26/2015  CLINICAL DATA:  Left testicle pain for 1 day. EXAM: SCROTAL ULTRASOUND DOPPLER ULTRASOUND OF THE TESTICLES TECHNIQUE: Complete ultrasound examination of the testicles, epididymis, and other scrotal structures was performed. Color and spectral Doppler ultrasound were also utilized to evaluate blood flow to the testicles. COMPARISON:  None. FINDINGS: Right testicle Measurements: 3.4 x 2.3 x 2.8 cm. No mass or microlithiasis visualized. Left testicle Measurements: 3.5 x 2.1 x 2.5 cm. No mass or microlithiasis visualized. No gross abnormality in the left inguinal canal which is the area of concern. Right epididymis:  Normal in size and appearance. Left epididymis:  Normal in size and appearance. Hydrocele:  None visualized. Varicocele:  None visualized. Pulsed Doppler interrogation of both testes demonstrates normal low resistance arterial and venous waveforms bilaterally. IMPRESSION: Normal scrotal ultrasound.  Negative for testicular torsion. Electronically Signed   By: Richarda Overlie M.D.   On: 09/26/2015 15:51      MDM   Final diagnoses:  Pain in left testicle  Testicular mass     Afebrile, nontoxic patient with LLQ abdominal and left inguinal/testicular pain that began last night. Pt has had intermittent pain in the LLQ and left testicle x 6 months.  I was unable to palpate a hernia.  He has no vomiting, no distension, no change in bowel movement - no clinical concern for SBO.  He does not have cellulitis or swelling and denies any possibility of STD (and declines testing and treatment), doubt epididymitis/orchitis.  He does have a palpable nodule on his left testicle that was not noted on [place].  Pt does indicate that it is new.  I have strongly advised that he follow up with urology out of concern to r/o testicular cancer.  He verbalized understanding of this and indicates that he will call for an appointment.   D/C home with urology follow up.  Discussed result, findings, treatment, and follow up  with patient.  Pt given return precautions.  Pt verbalizes understanding and agrees with plan.       I personally performed the services described in this documentation, which was scribed in my presence. The recorded information has been reviewed and is accurate.    Trixie Dredge, PA-C 09/26/15 1838  Mancel Bale, MD 09/28/15 [phone]

## 2017-07-13 IMAGING — US US ART/VEN ABD/PELV/SCROTUM DOPPLER LTD
1 series · 14 of 25 positions shown · non-contrast
Comparison: None.

CLINICAL DATA: Left testicle pain for 1 day.

EXAM:
SCROTAL ULTRASOUND
DOPPLER ULTRASOUND OF THE TESTICLES
TECHNIQUE: Complete ultrasound examination of the testicles, epididymis, and
other scrotal structures was performed. Color and spectral Doppler
ultrasound were also utilized to evaluate blood flow to the
testicles.

[Series 1: us art/ven abd/pelv/scrotum doppler ltd · 0.06mm/px · 14 of 52 slices shown]
[im 1/52]
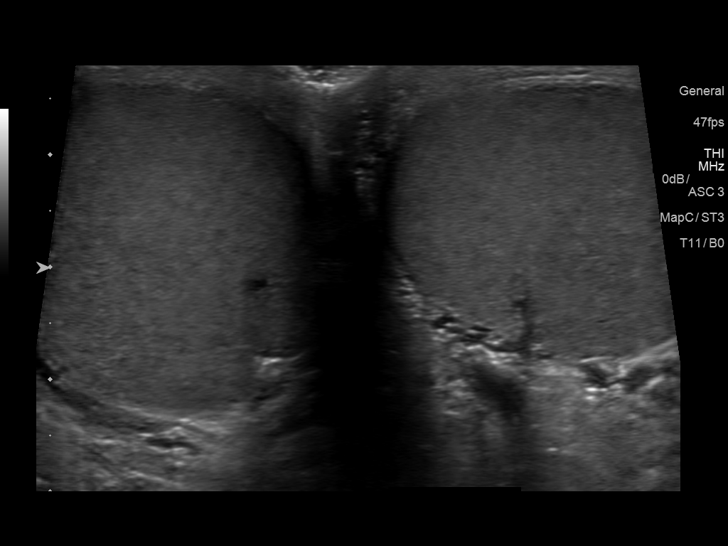
[im 5/52]
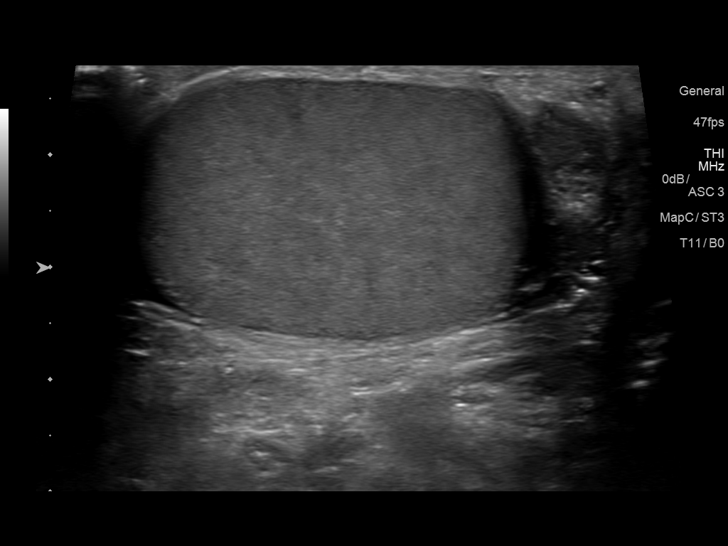
[im 9/52]
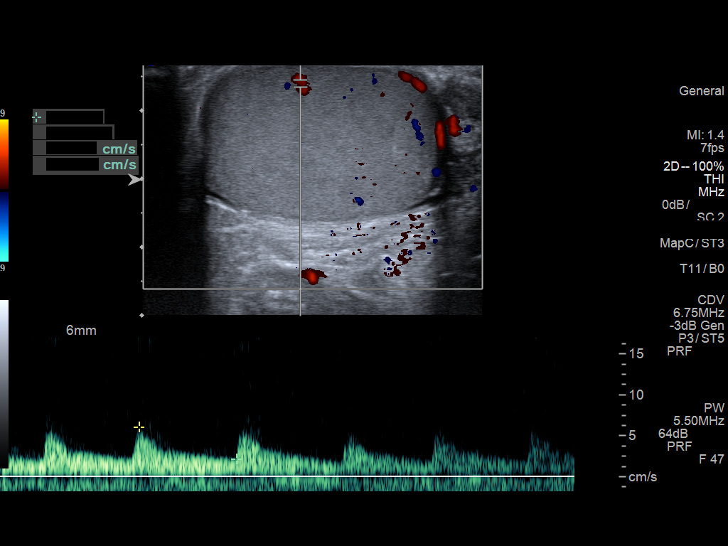
[im 13/52]
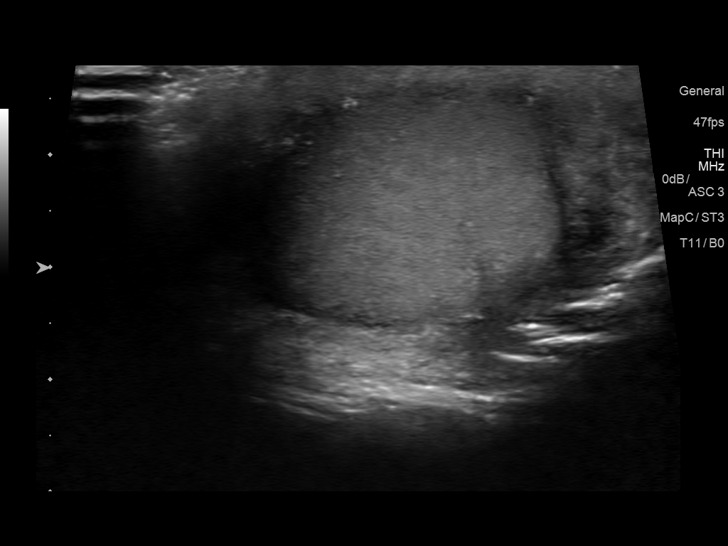
[im 18/52]
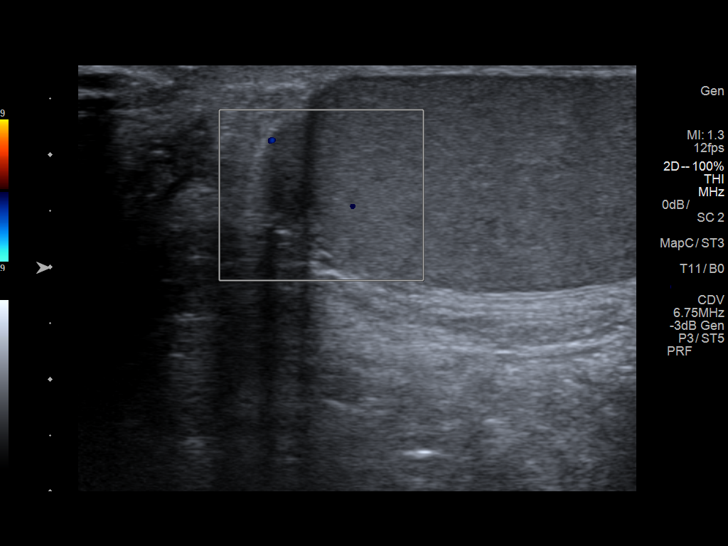
[im 20/52]
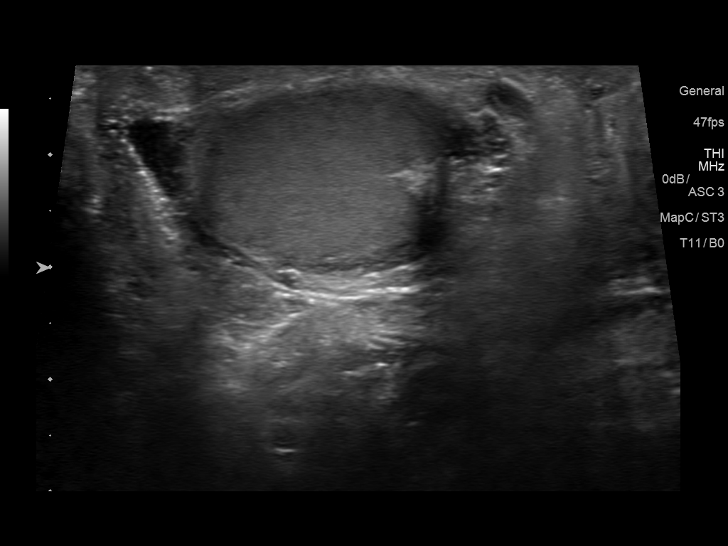
[im 24/52]
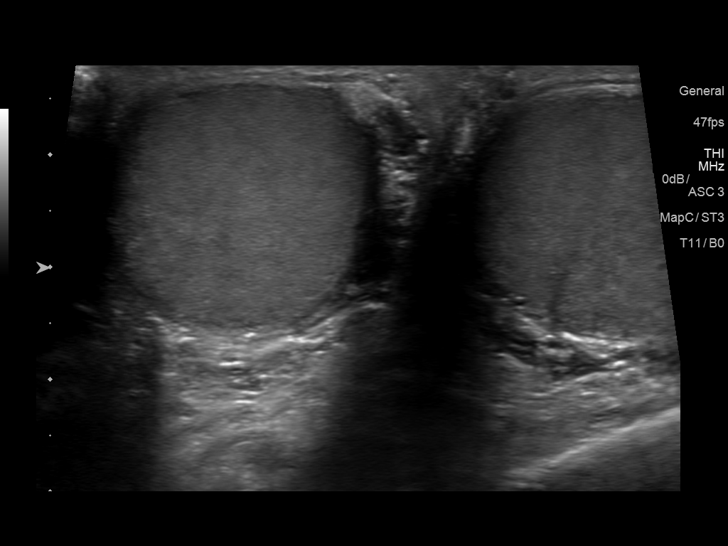
[im 28/52]
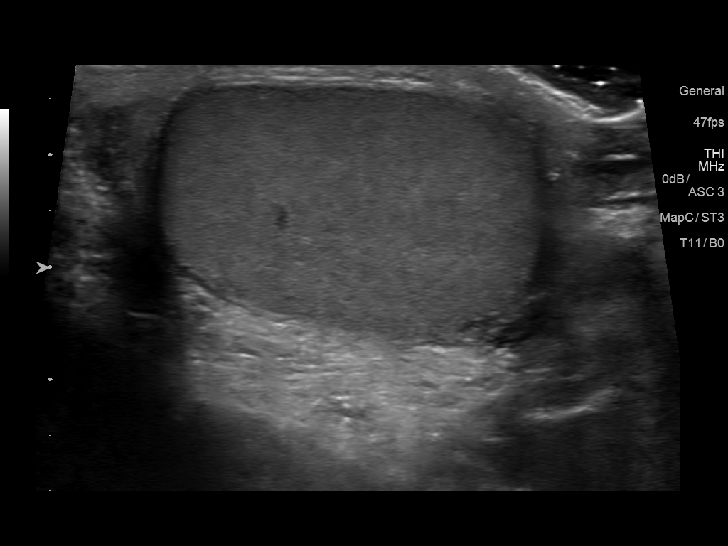
[im 32/52]
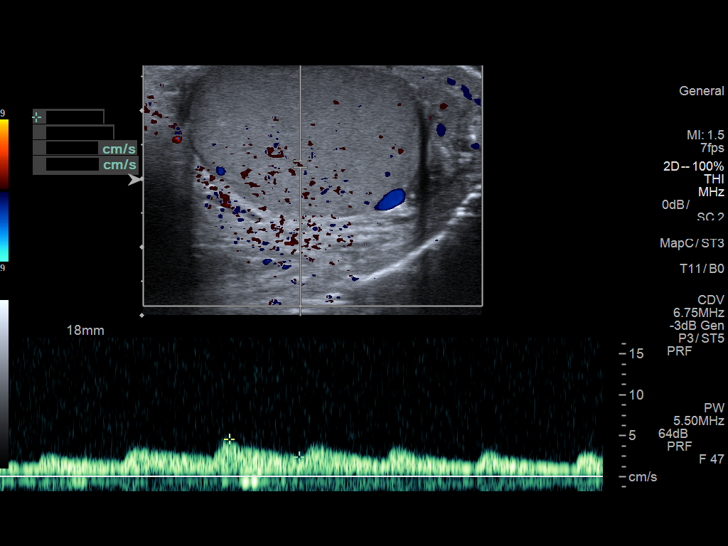
[im 35/52]
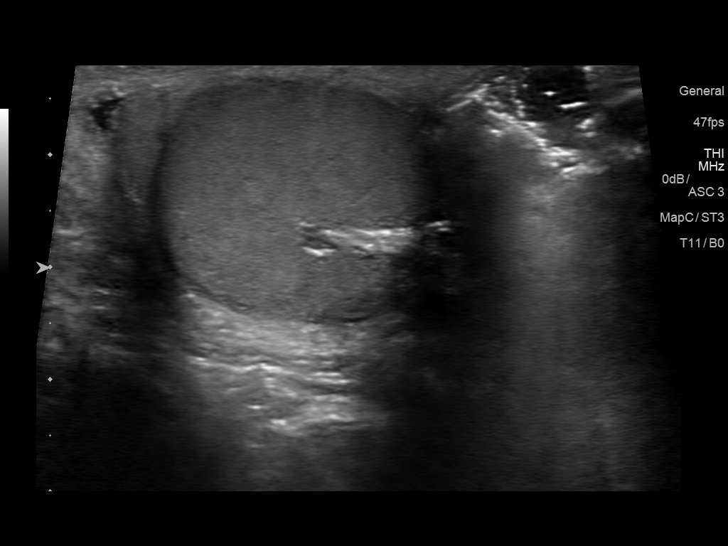
[im 39/52]
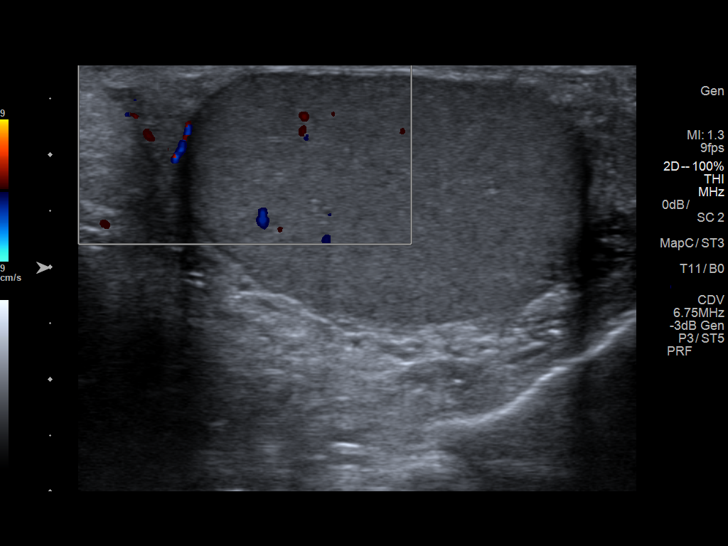
[im 43/52]
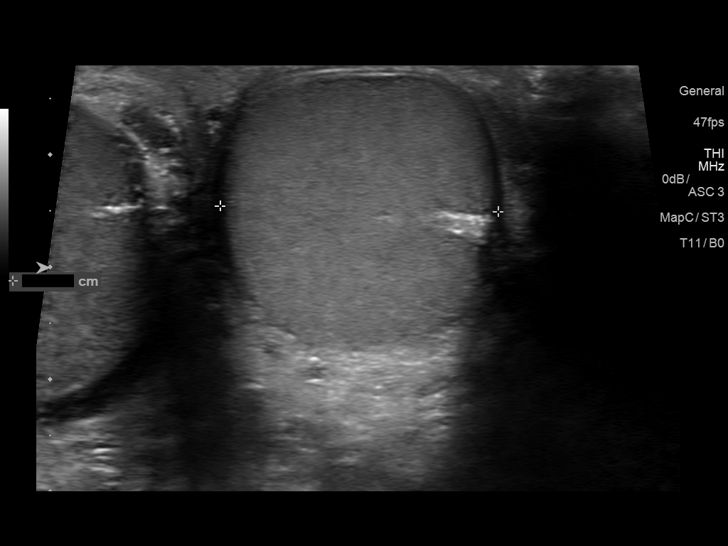
[im 47/52]
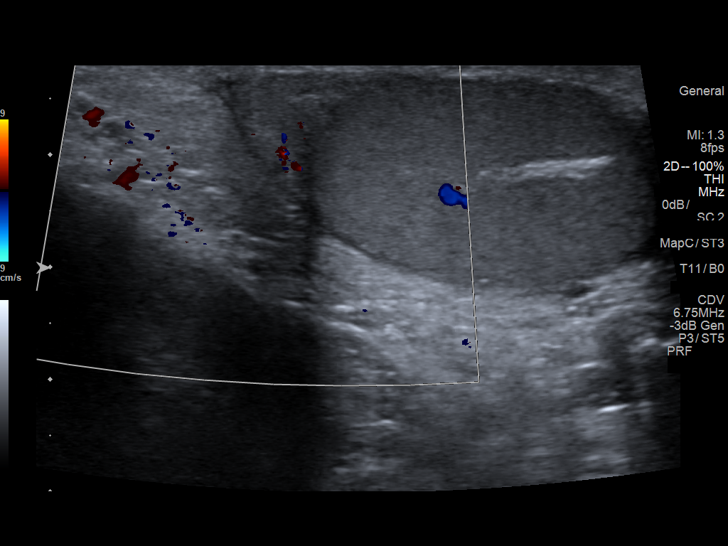
[im 52/52]
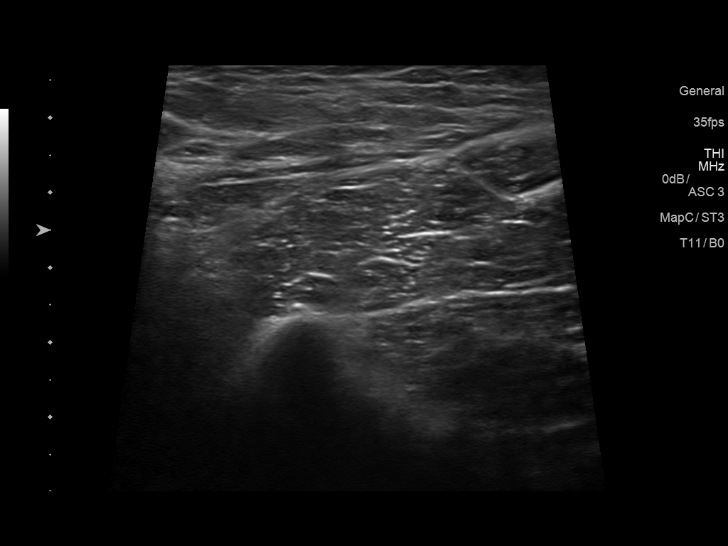

[14 of 25 positions shown; findings below may reference images not displayed]

FINDINGS: Right testicle

Measurements: 3.4 x 2.3 x 2.8 cm. No mass or microlithiasis
visualized.

Left testicle

Measurements: 3.5 x 2.1 x 2.5 cm. No mass or microlithiasis
visualized. No gross abnormality in the left inguinal canal which is
the area of concern.

Right epididymis:  Normal in size and appearance.

Left epididymis:  Normal in size and appearance.

Hydrocele:  None visualized.

Varicocele:  None visualized.

Pulsed Doppler interrogation of both testes demonstrates normal low
resistance arterial and venous waveforms bilaterally.
IMPRESSION: Normal scrotal ultrasound.  Negative for testicular torsion.
